# Patient Record
Sex: Female | Born: 1995 | Race: White | Hispanic: No | Marital: Single | Smoking: Current some day smoker
Health system: Southern US, Community
[De-identification: ages and names within clinical notes are randomized; demographics above are authoritative.]

## PROBLEM LIST (undated history)

## (undated) DIAGNOSIS — R0981 Nasal congestion: Secondary | ICD-10-CM

## (undated) DIAGNOSIS — H5010 Unspecified exotropia: Secondary | ICD-10-CM

## (undated) DIAGNOSIS — R51 Headache: Secondary | ICD-10-CM

## (undated) DIAGNOSIS — R519 Headache, unspecified: Secondary | ICD-10-CM

## (undated) HISTORY — PX: EYE SURGERY: SHX253

---
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
## 2018-05-01 ENCOUNTER — Ambulatory Visit (INDEPENDENT_AMBULATORY_CARE_PROVIDER_SITE_OTHER): Payer: Medicaid Other | Admitting: Cardiology

## 2018-05-01 ENCOUNTER — Encounter: Payer: Self-pay | Admitting: Cardiology

## 2018-05-01 ENCOUNTER — Ambulatory Visit (INDEPENDENT_AMBULATORY_CARE_PROVIDER_SITE_OTHER): Payer: Medicaid Other

## 2018-05-01 DIAGNOSIS — I10 Essential (primary) hypertension: Secondary | ICD-10-CM | POA: Insufficient documentation

## 2018-05-01 DIAGNOSIS — R002 Palpitations: Secondary | ICD-10-CM | POA: Diagnosis not present

## 2018-05-01 DIAGNOSIS — R0789 Other chest pain: Secondary | ICD-10-CM | POA: Diagnosis not present

## 2018-05-01 DIAGNOSIS — I491 Atrial premature depolarization: Secondary | ICD-10-CM | POA: Diagnosis not present

## 2018-05-01 LAB — ECHOCARDIOGRAM COMPLETE
Height: 60.75 in
Weight: 2944 oz

## 2018-05-01 NOTE — Addendum Note (Signed)
Addended by: Lita Mains on: 05/01/2018 02:36 PM   Modules accepted: Orders

## 2018-05-01 NOTE — Patient Instructions (Addendum)
Medication Instructions:  °Your physician recommends that you continue on your current medications as directed. Please refer to the Current Medication list given to you today. ° °If you need a refill on your cardiac medications before your next appointment, please [place] your pharmacy.  ° °Lab work: °None. ° °If you have labs (blood work) drawn today and your tests are completely normal, you will receive your results only by: °• MyChart Message (if you have MyChart) OR °• A paper copy in the mail °If you have any lab test that is abnormal or we need to change your treatment, we will Dock you to review the results. ° °Testing/Procedures: °Your physician has recommended that you wear a holter monitor. Holter monitors are medical devices that record the heart’s electrical activity. Doctors most often use these monitors to diagnose arrhythmias. Arrhythmias are problems with the speed or rhythm of the heartbeat. The monitor is a small, portable device. You can wear one while you do your normal daily activities. This is usually used to diagnose what is causing palpitations/syncope (passing out). Wear for 14 days.  ° °Your physician has requested that you have an echocardiogram. Echocardiography is a painless test that uses sound waves to create images of your heart. It provides your doctor with information about the size and shape of your heart and how well your heart’s chambers and valves are working. This procedure takes approximately one hour. There are no restrictions for this procedure. ° ° ° ° ° ° °Follow-Up: °At [facility], you and your health needs are our priority.  As part of our continuing mission to provide you with exceptional heart care, we have created designated Provider Care Teams.  These Care Teams include your primary Cardiologist (physician) and Advanced Practice Providers (APPs -  Physician Assistants and Nurse Practitioners) who all work together to provide you with the care you need, when you need  it. °You will need a follow up appointment in 1 months.  Please [place] our office 2 months in advance to schedule this appointment.  You may see No primary care provider on file. or another member of our [facility] Provider Team in [place]: °Brian Munley, MD °• Rajan Revankar, MD ° °Any Other Special Instructions Will Be Listed Below (If Applicable). ° ° °Echocardiogram °An echocardiogram is a procedure that uses painless sound waves (ultrasound) to produce an image of the heart. Images from an echocardiogram can provide important information about: °· Signs of coronary artery disease (CAD). °· Aneurysm detection. An aneurysm is a weak or damaged part of an artery wall that bulges out from the normal force of blood pumping through the body. °· Heart size and shape. Changes in the size or shape of the heart can be associated with certain conditions, including heart failure, aneurysm, and CAD. °· Heart muscle function. °· Heart valve function. °· Signs of a past heart attack. °· Fluid buildup around the heart. °· Thickening of the heart muscle. °· A tumor or infectious growth around the heart valves. °Tell a health care provider about: °· Any allergies you have. °· All medicines you are taking, including vitamins, herbs, eye drops, creams, and over-the-counter medicines. °· Any blood disorders you have. °· Any surgeries you have had. °· Any medical conditions you have. °· Whether you are pregnant or may be pregnant. °What are the risks? °Generally, this is a safe procedure. However, problems may occur, including: °· Allergic reaction to dye (contrast) that may be used during the procedure. °What happens   before the procedure? °No specific preparation is needed. You may eat and drink normally. °What happens during the procedure? ° °· An IV tube may be inserted into one of your veins. °· You may receive contrast through this tube. A contrast is an injection that improves the quality of the pictures from your  heart. °· A gel will be applied to your chest. °· A wand-like tool (transducer) will be moved over your chest. The gel will help to transmit the sound waves from the transducer. °· The sound waves will harmlessly bounce off of your heart to allow the heart images to be captured in real-time motion. The images will be recorded on a computer. °The procedure may vary among health care providers and hospitals. °What happens after the procedure? °· You may return to your normal, everyday life, including diet, activities, and medicines, unless your health care provider tells you not to do that. °Summary °· An echocardiogram is a procedure that uses painless sound waves (ultrasound) to produce an image of the heart. °· Images from an echocardiogram can provide important information about the size and shape of your heart, heart muscle function, heart valve function, and fluid buildup around your heart. °· You do not need to do anything to prepare before this procedure. You may eat and drink normally. °· After the echocardiogram is completed, you may return to your normal, everyday life, unless your health care provider tells you not to do that. °This information is not intended to replace advice given to you by your health care provider. Make sure you discuss any questions you have with your health care provider. °Document Released: 02/20/2000 Document Revised: 03/27/2016 Document Reviewed: 03/27/2016 °Elsevier Interactive Patient Education © 2019 [employer] ° ° ° °

## 2018-05-01 NOTE — Progress Notes (Addendum)
Cardiology Office Note:    Date:  05/01/2018   ID:  Raoul Pitch Angeletti, DOB [birth year], MRN [id number]  PCP:  Charlene Brooke, MD  Cardiologist:  Gypsy Balsam, MD    Referring MD: Charlene Brooke, MD   Chief Complaint  Patient presents with  . Hospitalization Follow-up  I have palpitations  History of Present Illness:    [name] is a 23 y.o. female with essential hypertension that was diagnosed 3 years ago she does not have blood pressure monitor at home she does not check her blood pressure at home every single time she gets in look for medical detention for whatever reason she was noted to have elevated blood pressure she was given lisinopril and blood pressure today is good.  Another complaint she has is palpitations it happens about twice a week could be abrupt with no warning and she started having a flash like sensation and then her heart would start speeding up she cannot tell me if it is regular or irregular taking few deep breaths and laying down typically interruptus arrhythmia interestingly EKG done in the emergency room showed ectopic atrial rhythm.  She never passed out but she get dizzy sometimes and sometimes she has to go on her knees because of this palpitations.  Palpitations typically associated with sensation in the chest that she described as pain.  Could be prediabetic.  At the same time she works physically and she has no difficulty doing it if she does not have any arrhythmia if she does have any palpitation she feels great and she can do whatever she wants to do.  Apparently year ago she wore some monitor she was told to have some arrhythmia she was referred to cardiology and never follow-up with that.  Past Medical History:  Diagnosis Date  . 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 Headache    due to eye strain from strabismus, per mother  . Stuffy nose 04/23/2014    Past Surgical History:  Procedure Laterality Date  . EYE SURGERY    . STRABISMUS SURGERY  03/20/2004  .  STRABISMUS SURGERY Bilateral 04/26/2014   Procedure: REPAIR STRABISMUS BILATERAL;  Surgeon: Shara Blazing, MD;  Location: [facility];  Service: Ophthalmology;  Laterality: Bilateral;    Current Medications: Current Meds  Medication Sig  . lisinopril (PRINIVIL,ZESTRIL) 5 MG tablet Take 5 mg by mouth daily.     Allergies:   Penicillin g   Social History   Socioeconomic History  . Marital status: Single    Spouse name: Not on file  . Number of children: Not on file  . Years of education: Not on file  . Highest education level: Not on file  Occupational History  . Not on file  Social Needs  . Financial resource strain: Not on file  . Food insecurity:    Worry: Not on file    Inability: Not on file  . Transportation needs:    Medical: Not on file    Non-medical: Not on file  Tobacco Use  . Smoking status: Current Some Day Smoker    Packs/day: 0.50    Types: Cigarettes  . Smokeless tobacco: Never Used  Substance and Sexual Activity  . Alcohol use: Yes    Comment: occasional  . Drug use: Yes    Types: Marijuana  . Sexual activity: Not on file  Lifestyle  . Physical activity:    Days per week: Not on file    Minutes per session: Not on  file  . Stress: Not on file  Relationships  . Social connections:    Talks on phone: Not on file    Gets together: Not on file    Attends religious service: Not on file    Active member of club or organization: Not on file    Attends meetings of clubs or organizations: Not on file    Relationship status: Not on file  Other Topics Concern  . Not on file  Social History Narrative  . Not on file     Family History: The patient's family history includes Diabetes in her father and mother; Hypertension in her father and mother. ROS:   Please see the history of present illness.    All 14 point review of systems negative except as described per history of present illness  EKGs/Labs/Other Studies Reviewed:    EKG done  today showed ectopic atrial rhythm rate of 74 short PR interval normal QS complex duration morphology  Recent Labs: No results found for requested labs within last 8760 hours.  Recent Lipid Panel    Component Value Date/Time   CHOL  02/18/2007 0615    135        ATP III CLASSIFICATION:  <200     mg/dL   Desirable  017-510  mg/dL   Borderline High  >=258    mg/dL   High   TRIG 527 78/24/2353 0615   HDL 34 (L) 02/18/2007 0615   CHOLHDL 4.0 02/18/2007 0615   VLDL 28 02/18/2007 0615   LDLCALC  02/18/2007 0615    73        Total Cholesterol/HDL:CHD Risk Coronary Heart Disease Risk Table                     Men   Women  1/2 Average Risk   3.4   3.3    Physical Exam:    VS:  BP 122/64   Pulse 86   Ht 5' 0.75" (1.543 m)   Wt 184 lb (83.5 kg)   SpO2 98%   BMI 35.05 kg/m     Wt Readings from Last 3 Encounters:  05/01/18 184 lb (83.5 kg)  04/26/14 161 lb 5 oz (73.2 kg) (90 %, Z= 1.29)*   * Growth percentiles are based on CDC (Girls, 2-20 Years) data.     GEN:  Well nourished, well developed in no acute distress HEENT: Normal NECK: No JVD; No carotid bruits LYMPHATICS: No lymphadenopathy CARDIAC: RRR, no murmurs, no rubs, no gallops RESPIRATORY:  Clear to auscultation without rales, wheezing or rhonchi  ABDOMEN: Soft, non-tender, non-distended MUSCULOSKELETAL:  No edema; No deformity  SKIN: Warm and dry LOWER EXTREMITIES: no swelling NEUROLOGIC:  Alert and oriented x 3 PSYCHIATRIC:  Normal affect   ASSESSMENT:    1. Palpitations   2. Ectopic atrial rhythm   3. Atypical chest pain   4. Essential hypertension    PLAN:    In order of problems listed above:  1. Palpitations.  I will ask her to wear 14 days monitor to see exactly what comfort may be dealing with.  Also will try to get a copy of the monitor from her primary care physician that was done a year ago. 2. Ectopic atrial rhythm.  Of unknown clinical significance at the moment.  I will ask her to have  echocardiogram to assess left ventricular ejection fraction. 3. Atypical chest pain not related to exercise happening only when she got palpitations.  We will  not pursue CAD work-up at the moment but that will be revisited again when I see him next time. 4. Essential hypertension.  Blood pressure elevated previously I asked him to get blood pressure monitor and check her blood pressure on the regular basis we will continue with ACE inhibitor for now. 5. There is some issue about her potentially having Turner syndrome she is short stature however she does have a son as well and she was pregnant once which will be highly unlikely for somebody with Turner syndrome only about 2% of women with Turner syndrome able to have children.   Medication Adjustments/Labs and Tests Ordered: Current medicines are reviewed at length with the patient today.  Concerns regarding medicines are outlined above.  No orders of the defined types were placed in this encounter.  Medication changes: No orders of the defined types were placed in this encounter.   Signed, Georgeanna Lea, MD, [facility] 05/01/2018 2:19 PM    [facility]

## 2018-05-01 NOTE — Progress Notes (Signed)
Complete echocardiogram has been performed.  Jimmy Kassiah Mccrory RDCS, RVT 

## 2018-05-05 ENCOUNTER — Ambulatory Visit (INDEPENDENT_AMBULATORY_CARE_PROVIDER_SITE_OTHER): Payer: Medicaid Other

## 2018-05-05 DIAGNOSIS — R0789 Other chest pain: Secondary | ICD-10-CM

## 2018-05-05 DIAGNOSIS — R002 Palpitations: Secondary | ICD-10-CM

## 2018-05-26 ENCOUNTER — Telehealth: Payer: Self-pay | Admitting: Emergency Medicine

## 2018-05-26 NOTE — Telephone Encounter (Signed)
Left message for patient to return [place] regarding upcoming appointment.

## 2018-05-29 ENCOUNTER — Ambulatory Visit: Payer: Medicaid Other | Admitting: Cardiology

## 2018-05-29 NOTE — Telephone Encounter (Signed)
Still unable to reach patient.

## 2018-05-30 ENCOUNTER — Telehealth: Payer: Self-pay | Admitting: *Deleted

## 2018-05-30 NOTE — Telephone Encounter (Signed)
Left message on [employer] phone (pt's mom). Left number for them to Bartha me back about the monitor. Need to know how and when it was mailed.

## 2018-09-05 ENCOUNTER — Telehealth: Payer: Self-pay | Admitting: Cardiology

## 2018-09-05 NOTE — Telephone Encounter (Signed)
Patient called back and informed of monitor results.

## 2018-09-05 NOTE — Telephone Encounter (Signed)
Patient is requesting a [place] to discuss the results of her 04/2018 monitor.  Please [place] patient [phone]

## 2020-09-23 ENCOUNTER — Encounter (HOSPITAL_COMMUNITY): Payer: Self-pay

## 2020-09-23 ENCOUNTER — Emergency Department (HOSPITAL_COMMUNITY): Payer: Medicaid Other

## 2020-09-23 ENCOUNTER — Emergency Department (HOSPITAL_COMMUNITY)
Admission: EM | Admit: 2020-09-23 | Discharge: 2020-09-23 | Disposition: A | Discharge status: Home / Self Care | Payer: Medicaid Other | Attending: Emergency Medicine | Admitting: Emergency Medicine

## 2020-09-23 DIAGNOSIS — Z79899 Other long term (current) drug therapy: Secondary | ICD-10-CM | POA: Diagnosis not present

## 2020-09-23 DIAGNOSIS — W010XXA Fall on same level from slipping, tripping and stumbling without subsequent striking against object, initial encounter: Secondary | ICD-10-CM | POA: Insufficient documentation

## 2020-09-23 DIAGNOSIS — F17210 Nicotine dependence, cigarettes, uncomplicated: Secondary | ICD-10-CM | POA: Diagnosis not present

## 2020-09-23 DIAGNOSIS — Y990 Civilian activity done for income or pay: Secondary | ICD-10-CM | POA: Diagnosis not present

## 2020-09-23 DIAGNOSIS — S52125A Nondisplaced fracture of head of left radius, initial encounter for closed fracture: Secondary | ICD-10-CM | POA: Insufficient documentation

## 2020-09-23 DIAGNOSIS — I10 Essential (primary) hypertension: Secondary | ICD-10-CM | POA: Diagnosis not present

## 2020-09-23 DIAGNOSIS — S6992XA Unspecified injury of left wrist, hand and finger(s), initial encounter: Secondary | ICD-10-CM | POA: Diagnosis present

## 2020-09-23 MED ORDER — HYDROCODONE-ACETAMINOPHEN 5-325 MG PO TABS: 1.0000 | ORAL_TABLET | ORAL | 0 refills | Status: AC | PRN

## 2020-09-23 MED ORDER — IBUPROFEN 400 MG PO TABS: 800.0000 mg | ORAL_TABLET | Freq: Once | ORAL | Status: DC

## 2020-09-23 NOTE — Progress Notes (Signed)
Orthopedic Tech Progress Note Patient Details:  [name] 16-Jul-1995 [id number]  PER PA to apply long arm splint with help from MD STUDENT   Ortho Devices Type of Ortho Device: Sling immobilizer, Long arm splint Ortho Device/Splint Location: LUE Ortho Device/Splint Interventions: Application, Adjustment   Post Interventions Patient Tolerated: Well Instructions Provided: Care of device  Donald Pore 09/23/2020, 10:48 AM

## 2020-09-23 NOTE — ED Provider Notes (Signed)
[facility] EMERGENCY DEPARTMENT Provider Note   CSN: [id number] Arrival date & time: 09/23/20  0449     History Chief Complaint  Patient presents with   [name] is a 25 y.o. female.  Pt fell at work and landed with hand out   The history is provided by the patient. No language interpreter was used.  Fall This is a new problem. The current episode started 3 to 5 hours ago. The problem occurs constantly. The problem has been gradually worsening. Nothing aggravates the symptoms. Nothing relieves the symptoms. She has tried nothing for the symptoms.      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     Family History  Problem Relation Age of Onset   Hypertension Mother    Diabetes Mother    Hypertension Father    Diabetes Father     Social History   Tobacco Use   Smoking status: Some Days    Packs/day: 0.50    Types: Cigarettes   Smokeless tobacco: Never  Vaping Use   Vaping Use: Never used  Substance Use Topics   Alcohol use: Yes    Comment: occasional   Drug use: Yes    Types: Marijuana    Home Medications Prior to Admission medications   Medication Sig Start Date End Date Taking? Authorizing Provider  lisinopril (PRINIVIL,ZESTRIL) 5 MG tablet Take 5 mg by mouth daily.     [provider]    Allergies    Penicillin g  Review of Systems   Review of Systems  Musculoskeletal:  Positive for joint swelling and myalgias.  All other systems reviewed and are negative.  Physical Exam Updated Vital Signs BP (!) 150/101 (BP Location: Right Arm)   Pulse 89   Temp 98.1 F (36.7 C) (Oral)   Resp 16   SpO2 100%   Physical Exam Vitals reviewed.  Cardiovascular:     Rate and Rhythm: Normal rate.  Pulmonary:     Effort: Pulmonary effort is normal.  Musculoskeletal:        General: Swelling and tenderness present.     Comments: Tender elbow, pain with movement  nv and ns itnact  Skin:    General: Skin is warm.  Neurological:     General: No focal deficit present.     Mental Status: She is alert.  Psychiatric:        Mood and Affect: Mood normal.    ED Results / Procedures / Treatments   Labs (all labs ordered are listed, but only abnormal results are displayed) Labs Reviewed - No data to display  EKG None  Radiology DG Cervical Spine Complete  Result Date: 09/23/2020 CLINICAL DATA:  Fall.  Pain. EXAM: CERVICAL SPINE - COMPLETE 4+  VIEW COMPARISON:  07/15/2015. FINDINGS: Loss of normal cervical lordosis again noted. No evidence of fracture or dislocation. Pulmonary apices are clear. IMPRESSION: Loss of normal cervical lordosis again noted. No acute bony abnormality identified. Electronically Signed   By: Maisie Fus  Register   On: 09/23/2020 05:51   DG Elbow Complete Left  Result Date: 09/23/2020 CLINICAL DATA:  Fall.  Pain. EXAM: LEFT ELBOW - COMPLETE 3+ VIEW COMPARISON:  No recent prior. FINDINGS: Slightly displaced fracture of the left radial head noted. Fracture extension into the adjacent joint space noted. No other fractures identified. No evidence of dislocation. IMPRESSION: Slightly displaced fracture of the radial head with extension into the adjacent joint space. Electronically Signed   By: Maisie Fus  Register   On: 09/23/2020 05:47   DG  Wrist Complete Left  Result Date: 09/23/2020 CLINICAL DATA:  Fall. EXAM: LEFT WRIST - COMPLETE 3+ VIEW COMPARISON:  05/28/2013. FINDINGS: There is no evidence of fracture or dislocation. There is no evidence of arthropathy or other focal bone abnormality. Soft tissues are unremarkable. IMPRESSION: No acute abnormality identified. Electronically Signed   By: Maisie Fus  Register   On: 09/23/2020 05:48    Procedures Procedures   Medications Ordered in ED Medications - No data to display  ED Course  I have reviewed the triage vital signs and the nursing notes.  Pertinent labs & imaging results that were available during my care of the patient were reviewed by me and considered in my medical decision making (see chart for details).    MDM Rules/Calculators/A&P                          MDM:  Herby Abraham shows a left radial head fracture.  Pt placed in a splint and sling Pt advised to follow up with Dr. Merlyn Lot for evaluation  Final Clinical Impression(s) / ED Diagnoses Final diagnoses:  Closed nondisplaced fracture of head of left radius, initial encounter    Rx / DC Orders ED Discharge Orders          Ordered    HYDROcodone-acetaminophen (NORCO/VICODIN) 5-325 MG tablet  Every 4 hours PRN        09/23/20 1040             Elson Areas, [place] 09/23/20 1041    Bethann Berkshire, MD 09/25/20 1002

## 2020-09-23 NOTE — ED Provider Notes (Signed)
Emergency Medicine Provider Triage Evaluation Note  [name] , a 25 y.o. female  was evaluated in triage.  Pt complains of left elbow pain and left wrist pain after fall around 8 PM.  Patient reports she slipped on a wet floor, extended her left arm behind her and fell onto it.  She reports significant pain in the elbow and wrist since that time.  Reports she went to sleep on her arm and now has decreased sensation in the left shoulder however has full range of motion of the left shoulder.  No treatments prior to arrival.  Review of Systems  Positive: Left elbow pain, swelling, decreased range of motion Negative: Headache, loss of consciousness  Physical Exam  BP (!) 156/100 (BP Location: Right Arm)   Pulse 94   Temp 97.9 F (36.6 C) (Oral)   Resp 16   SpO2 94%  Gen:   Awake, no distress   Resp:  Normal effort  MSK:   Tenderness to palpation of the left elbow with decreased range of motion.  Swelling and ecchymosis noted. Other:  Subjective decrease sensation in the posterior left shoulder.  Medical Decision Making  Medically screening exam initiated at 4:54 AM.  Appropriate orders placed.  [name] was informed that the remainder of the evaluation will be completed by another provider, this initial triage assessment does not replace that evaluation, and the importance of remaining in the ED until their evaluation is complete.  Pain after fall - images pending   Milta Deiters 09/23/20 0455    Mesner, Barbara Cower, MD 09/23/20 0530

## 2020-09-23 NOTE — ED Triage Notes (Signed)
Pt comes vai [facility], pt was at work and slipped on the floor and had a fall onto L arm, since has been having numbness and tingling

## 2020-09-23 NOTE — Discharge Instructions (Addendum)
Schedule to see the Orthopaedist for evaluation  

## 2020-09-23 NOTE — ED Notes (Signed)
Arm splint placed by ortho tech

## 2022-02-27 IMAGING — CR DG CERVICAL SPINE COMPLETE 4+V
5 series · 5 of 5 positions shown · non-contrast
Comparison: 07/15/2015.

CLINICAL DATA: Fall.  Pain.

EXAM:
CERVICAL SPINE - COMPLETE 4+ VIEW

[c-spine lat]
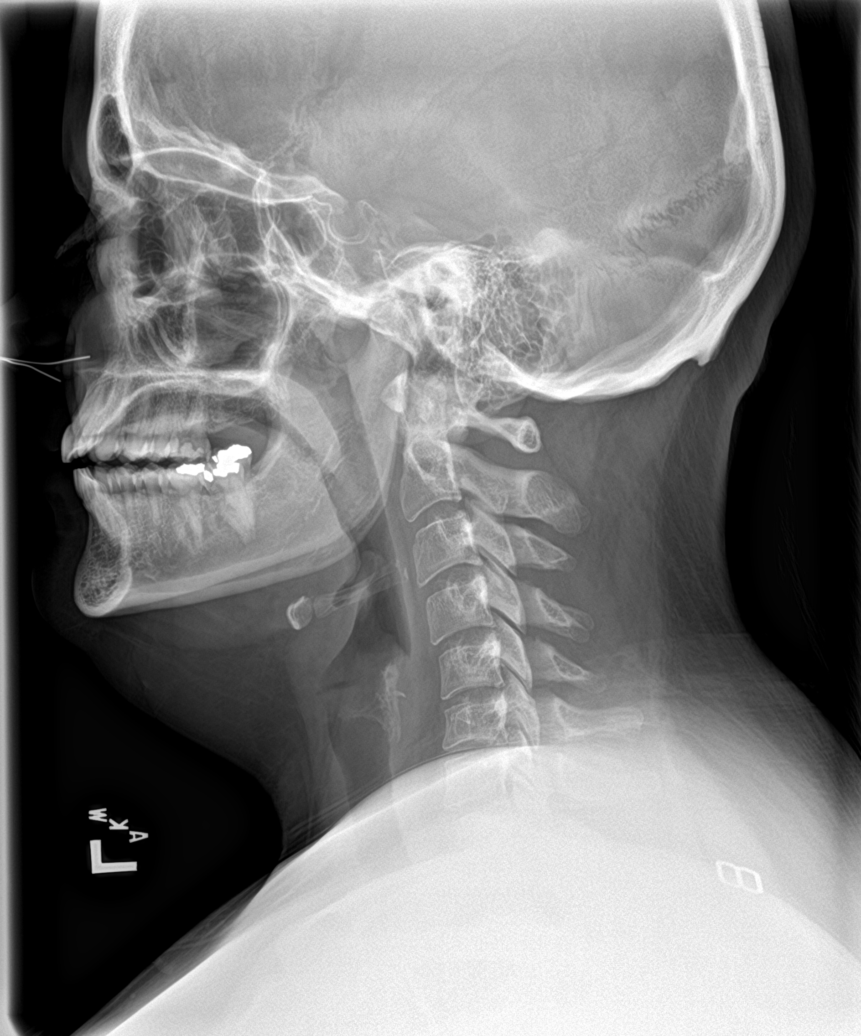

[c-spine obl (1 of 2)]
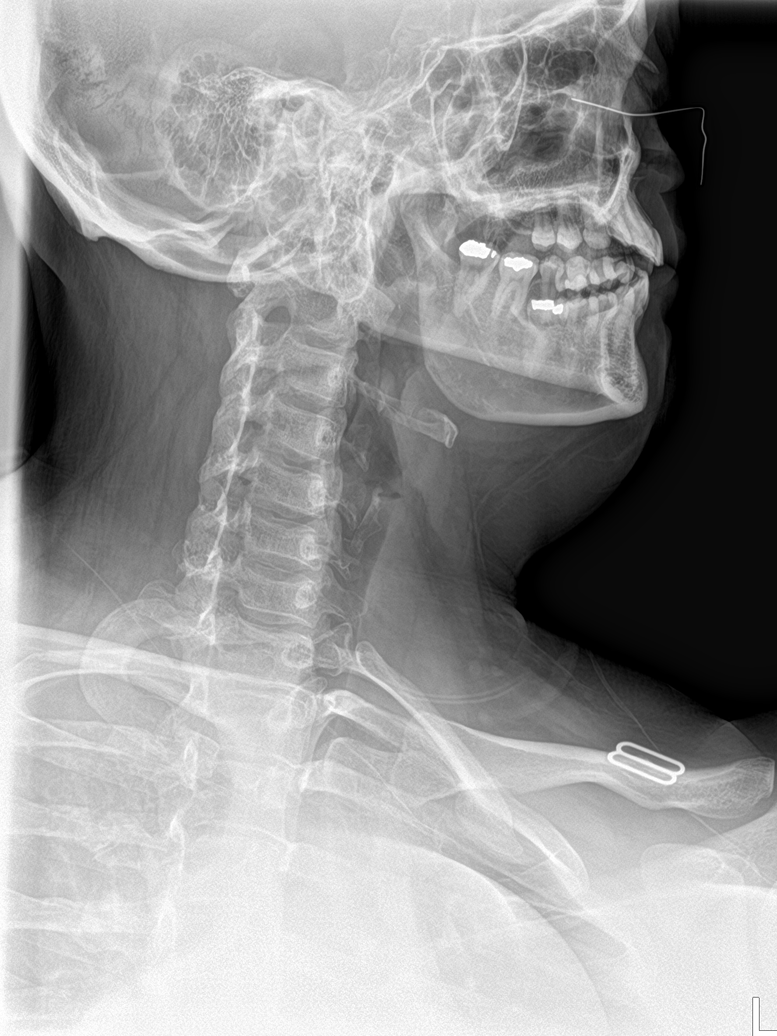

[c-spine obl (2 of 2)]
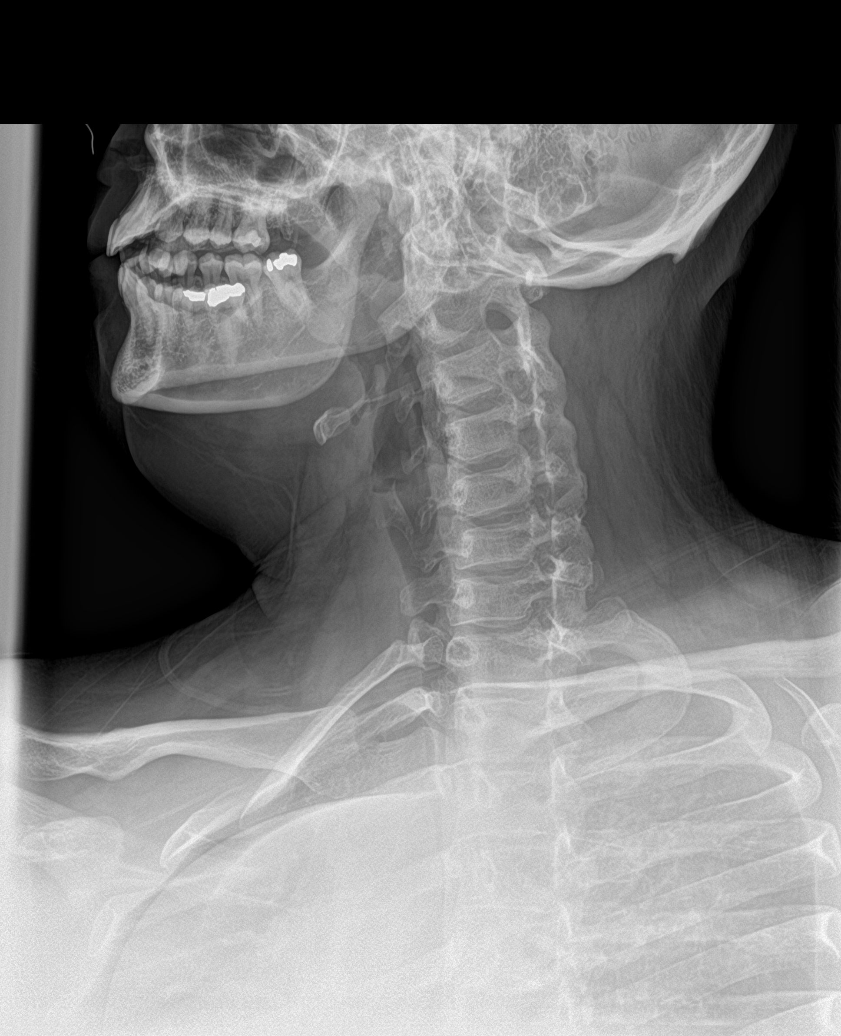

[c-spine ap]
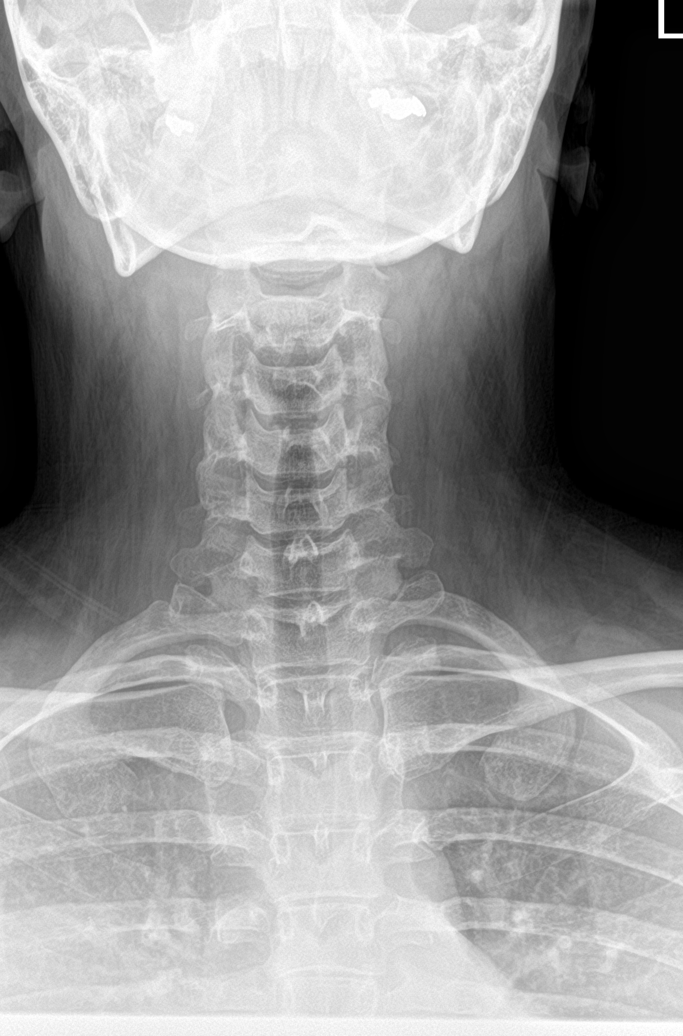

[c-spine swimmers]
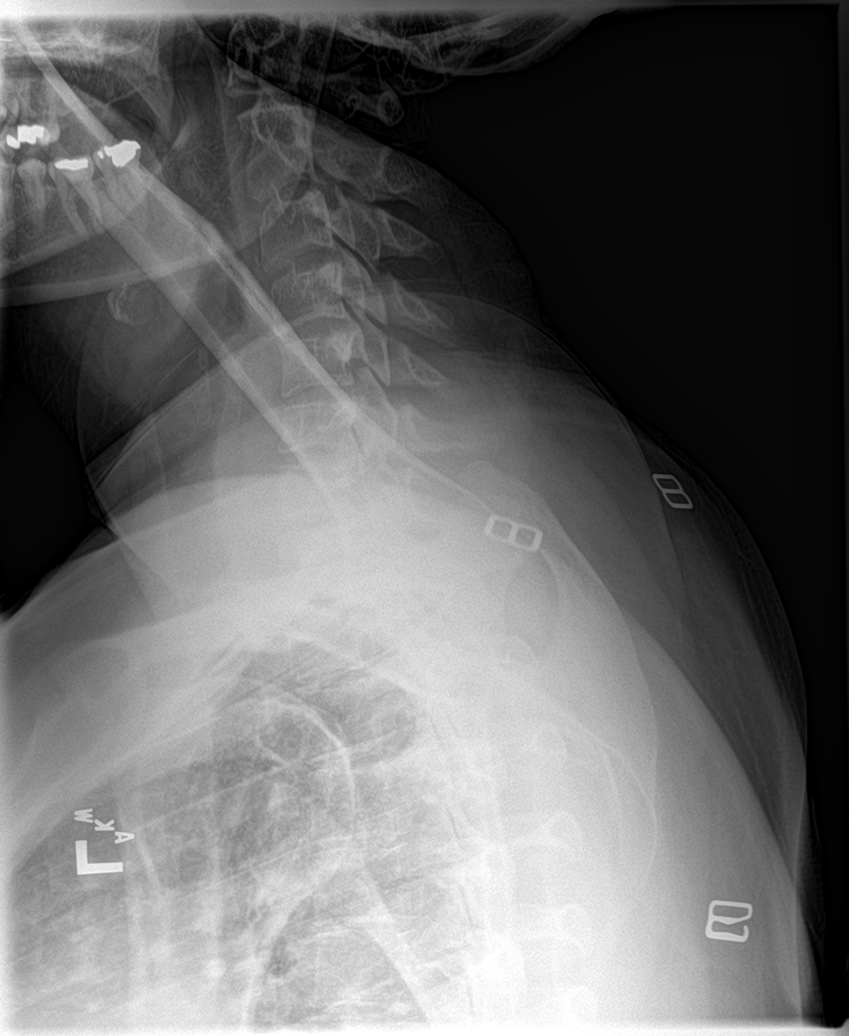

[5 of 5 positions shown; findings below may reference images not displayed]

FINDINGS: Loss of normal cervical lordosis again noted. No evidence of
fracture or dislocation. Pulmonary apices are clear.
IMPRESSION: Loss of normal cervical lordosis again noted. No acute bony
abnormality identified.

## 2023-01-17 ENCOUNTER — Telehealth: Payer: Self-pay | Admitting: Cardiology

## 2023-01-17 NOTE — Telephone Encounter (Signed)
   Pt c/o of Chest Pain: STAT if active CP, including tightness, pressure, jaw pain, radiating pain to shoulder/upper arm/back, CP unrelieved by Nitro. Symptoms reported of SOB, nausea, vomiting, sweating.  1. Are you having CP right now? About 30 min ago    2. Are you experiencing any other symptoms (ex. SOB, nausea, vomiting, sweating)? radiating pain to shoulder/upper arm/back; dizziness, headache   3. Is your CP continuous or coming and going? Coming and going   4. Have you taken Nitroglycerin? No    5. How long have you been experiencing CP? Last couple of days    6. If NO CP at time of [place] then end [facility] with telling Pt to [place] back or [place] 911 if Chest pain returns prior to return [place] from triage team.

## 2023-01-17 NOTE — Telephone Encounter (Signed)
NO answer x 2 att. No VM

## 2023-01-18 NOTE — Telephone Encounter (Signed)
Att x 1 to return pt [name]. No answer
